# Patient Record
Sex: Male | Born: 2000 | Race: Black or African American | Hispanic: No | Marital: Single | Smoking: Never smoker
Health system: Southern US, Community
[De-identification: ages and names within clinical notes are randomized; demographics above are authoritative.]

## PROBLEM LIST (undated history)

## (undated) DIAGNOSIS — R011 Cardiac murmur, unspecified: Secondary | ICD-10-CM

## (undated) DIAGNOSIS — F952 Tourette's disorder: Secondary | ICD-10-CM

## (undated) DIAGNOSIS — F909 Attention-deficit hyperactivity disorder, unspecified type: Secondary | ICD-10-CM

---
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
## 2020-01-20 DIAGNOSIS — F913 Oppositional defiant disorder: Secondary | ICD-10-CM | POA: Diagnosis not present

## 2020-01-20 DIAGNOSIS — F3481 Disruptive mood dysregulation disorder: Secondary | ICD-10-CM | POA: Diagnosis not present

## 2020-01-20 DIAGNOSIS — F902 Attention-deficit hyperactivity disorder, combined type: Secondary | ICD-10-CM | POA: Diagnosis not present

## 2020-03-10 DIAGNOSIS — F411 Generalized anxiety disorder: Secondary | ICD-10-CM | POA: Diagnosis not present

## 2020-03-12 DIAGNOSIS — F411 Generalized anxiety disorder: Secondary | ICD-10-CM | POA: Diagnosis not present

## 2020-04-14 DIAGNOSIS — Z23 Encounter for immunization: Secondary | ICD-10-CM | POA: Diagnosis not present

## 2020-05-01 DIAGNOSIS — F411 Generalized anxiety disorder: Secondary | ICD-10-CM | POA: Diagnosis not present

## 2020-05-05 DIAGNOSIS — Z23 Encounter for immunization: Secondary | ICD-10-CM | POA: Diagnosis not present

## 2020-05-13 DIAGNOSIS — F902 Attention-deficit hyperactivity disorder, combined type: Secondary | ICD-10-CM | POA: Diagnosis not present

## 2020-05-13 DIAGNOSIS — F913 Oppositional defiant disorder: Secondary | ICD-10-CM | POA: Diagnosis not present

## 2020-05-13 DIAGNOSIS — F3481 Disruptive mood dysregulation disorder: Secondary | ICD-10-CM | POA: Diagnosis not present

## 2020-07-08 DIAGNOSIS — F3481 Disruptive mood dysregulation disorder: Secondary | ICD-10-CM | POA: Diagnosis not present

## 2020-07-08 DIAGNOSIS — F913 Oppositional defiant disorder: Secondary | ICD-10-CM | POA: Diagnosis not present

## 2020-07-08 DIAGNOSIS — F902 Attention-deficit hyperactivity disorder, combined type: Secondary | ICD-10-CM | POA: Diagnosis not present

## 2020-09-10 DIAGNOSIS — F902 Attention-deficit hyperactivity disorder, combined type: Secondary | ICD-10-CM | POA: Diagnosis not present

## 2020-09-10 DIAGNOSIS — F3481 Disruptive mood dysregulation disorder: Secondary | ICD-10-CM | POA: Diagnosis not present

## 2020-09-10 DIAGNOSIS — F913 Oppositional defiant disorder: Secondary | ICD-10-CM | POA: Diagnosis not present

## 2020-12-07 DIAGNOSIS — F902 Attention-deficit hyperactivity disorder, combined type: Secondary | ICD-10-CM | POA: Diagnosis not present

## 2020-12-07 DIAGNOSIS — F3481 Disruptive mood dysregulation disorder: Secondary | ICD-10-CM | POA: Diagnosis not present

## 2020-12-07 DIAGNOSIS — F913 Oppositional defiant disorder: Secondary | ICD-10-CM | POA: Diagnosis not present

## 2021-01-28 ENCOUNTER — Ambulatory Visit (INDEPENDENT_AMBULATORY_CARE_PROVIDER_SITE_OTHER): Payer: Medicaid Other | Admitting: Family Medicine

## 2021-01-28 ENCOUNTER — Encounter: Payer: Self-pay | Admitting: Family Medicine

## 2021-01-28 ENCOUNTER — Other Ambulatory Visit: Payer: Self-pay

## 2021-01-28 DIAGNOSIS — Z0000 Encounter for general adult medical examination without abnormal findings: Secondary | ICD-10-CM

## 2021-01-28 DIAGNOSIS — E669 Obesity, unspecified: Secondary | ICD-10-CM

## 2021-01-28 NOTE — Assessment & Plan Note (Signed)
Patient is doing well at this time with no active concerns.  Reports his mood is well.  He would like to go to vet school.  We will follow-up in 1 year or sooner if needed.

## 2021-01-28 NOTE — Patient Instructions (Signed)
It was great seeing you today.  The only changes I think you need to make to your current health is dietary changes.  I know you like to eat a lot of junk foods but consider alternating junk food snacks with healthy options such as fruits or vegetables.  I also recommend decreasing her soda intake and switching out for water.  I am not going to collect any labs today.  If you have any issues, questions, concerns please feel free to call our clinic.  I hope you have a wonderful afternoon!

## 2021-01-28 NOTE — Assessment & Plan Note (Signed)
Discussed healthy eating habits today such as transitioning to healthy snacks such as carrots and celery rather than chips at least some of the time.  Also encouraged decreasing the amount of soda and increasing the amount of water.  We discussed exercise as well.  We will follow-up at next visit.  Patient had no other questions or concerns

## 2021-01-28 NOTE — Progress Notes (Signed)
    SUBJECTIVE:   CHIEF COMPLAINT / HPI:   Annual Physical  and discussed obesity Patient reports no current concerns or issues.  We discussed his dietary habits and he reports that he does eat a lot of junk food.  His predominant snack is chips and he drinks soda and some water.  He reports that he does not exercise.  We discussed healthy eating habits and exercise regimens.   OBJECTIVE:   BP 119/69   Pulse 82   Ht 5\' 2"  (1.575 m)   Wt 196 lb 6.4 oz (89.1 kg)   SpO2 99%   BMI 35.92 kg/m   General: Well-appearing 20 year old male in no acute distress Cardiac: Regular rate and rhythm, no murmurs appreciated Respiratory: Normal work of breathing, lungs clear to auscultation bilaterally Abdomen: Soft, nontender, positive bowel sounds Extremities: No edema or gross deformities noted  PHQ9 SCORE ONLY 01/28/2021 11/27/2017  PHQ-9 Total Score 0 7     ASSESSMENT/PLAN:   Obesity (BMI 35.0-39.9 without comorbidity) Discussed healthy eating habits today such as transitioning to healthy snacks such as carrots and celery rather than chips at least some of the time.  Also encouraged decreasing the amount of soda and increasing the amount of water.  We discussed exercise as well.  We will follow-up at next visit.  Patient had no other questions or concerns  Health maintenance examination Patient is doing well at this time with no active concerns.  Reports his mood is well.  He would like to go to vet school.  We will follow-up in 1 year or sooner if needed.    10-14-1990, MD [facility]

## 2021-03-08 DIAGNOSIS — F913 Oppositional defiant disorder: Secondary | ICD-10-CM | POA: Diagnosis not present

## 2021-03-08 DIAGNOSIS — F902 Attention-deficit hyperactivity disorder, combined type: Secondary | ICD-10-CM | POA: Diagnosis not present

## 2021-03-08 DIAGNOSIS — F3481 Disruptive mood dysregulation disorder: Secondary | ICD-10-CM | POA: Diagnosis not present

## 2021-03-22 DIAGNOSIS — Z20822 Contact with and (suspected) exposure to covid-19: Secondary | ICD-10-CM | POA: Diagnosis not present

## 2021-03-27 DIAGNOSIS — Z20822 Contact with and (suspected) exposure to covid-19: Secondary | ICD-10-CM | POA: Diagnosis not present

## 2021-04-03 DIAGNOSIS — Z20822 Contact with and (suspected) exposure to covid-19: Secondary | ICD-10-CM | POA: Diagnosis not present

## 2021-04-10 DIAGNOSIS — F411 Generalized anxiety disorder: Secondary | ICD-10-CM | POA: Diagnosis not present

## 2021-04-10 DIAGNOSIS — Z20822 Contact with and (suspected) exposure to covid-19: Secondary | ICD-10-CM | POA: Diagnosis not present

## 2021-04-17 DIAGNOSIS — Z20822 Contact with and (suspected) exposure to covid-19: Secondary | ICD-10-CM | POA: Diagnosis not present

## 2021-04-19 DIAGNOSIS — F411 Generalized anxiety disorder: Secondary | ICD-10-CM | POA: Diagnosis not present

## 2021-04-24 DIAGNOSIS — F411 Generalized anxiety disorder: Secondary | ICD-10-CM | POA: Diagnosis not present

## 2021-04-25 DIAGNOSIS — F411 Generalized anxiety disorder: Secondary | ICD-10-CM | POA: Diagnosis not present

## 2021-04-28 DIAGNOSIS — F411 Generalized anxiety disorder: Secondary | ICD-10-CM | POA: Diagnosis not present

## 2021-05-07 DIAGNOSIS — Z20822 Contact with and (suspected) exposure to covid-19: Secondary | ICD-10-CM | POA: Diagnosis not present

## 2021-05-08 DIAGNOSIS — F411 Generalized anxiety disorder: Secondary | ICD-10-CM | POA: Diagnosis not present

## 2021-05-16 DIAGNOSIS — Z20822 Contact with and (suspected) exposure to covid-19: Secondary | ICD-10-CM | POA: Diagnosis not present

## 2021-05-20 DIAGNOSIS — Z20822 Contact with and (suspected) exposure to covid-19: Secondary | ICD-10-CM | POA: Diagnosis not present

## 2021-05-23 DIAGNOSIS — F411 Generalized anxiety disorder: Secondary | ICD-10-CM | POA: Diagnosis not present

## 2021-05-25 DIAGNOSIS — F411 Generalized anxiety disorder: Secondary | ICD-10-CM | POA: Diagnosis not present

## 2021-05-30 DIAGNOSIS — F411 Generalized anxiety disorder: Secondary | ICD-10-CM | POA: Diagnosis not present

## 2021-05-30 DIAGNOSIS — Z20822 Contact with and (suspected) exposure to covid-19: Secondary | ICD-10-CM | POA: Diagnosis not present

## 2021-06-03 DIAGNOSIS — F913 Oppositional defiant disorder: Secondary | ICD-10-CM | POA: Diagnosis not present

## 2021-06-03 DIAGNOSIS — F3481 Disruptive mood dysregulation disorder: Secondary | ICD-10-CM | POA: Diagnosis not present

## 2021-06-03 DIAGNOSIS — F902 Attention-deficit hyperactivity disorder, combined type: Secondary | ICD-10-CM | POA: Diagnosis not present

## 2021-06-04 DIAGNOSIS — Z20822 Contact with and (suspected) exposure to covid-19: Secondary | ICD-10-CM | POA: Diagnosis not present

## 2021-06-09 NOTE — Progress Notes (Deleted)
    SUBJECTIVE:   CHIEF COMPLAINT / HPI:   YESTERDAY BIRTHDAY   Blown Eardrum  PERTINENT  PMH / PSH: Obesity, ADHD  OBJECTIVE:   There were no vitals taken for this visit.  ***  ASSESSMENT/PLAN:   No problem-specific Assessment & Plan notes found for this encounter.     Bess Kinds, MD [facility]

## 2021-06-10 ENCOUNTER — Ambulatory Visit: Payer: Medicaid Other | Admitting: Student

## 2021-06-14 DIAGNOSIS — Z20822 Contact with and (suspected) exposure to covid-19: Secondary | ICD-10-CM | POA: Diagnosis not present

## 2021-06-21 DIAGNOSIS — Z20822 Contact with and (suspected) exposure to covid-19: Secondary | ICD-10-CM | POA: Diagnosis not present

## 2021-08-17 DIAGNOSIS — F411 Generalized anxiety disorder: Secondary | ICD-10-CM | POA: Diagnosis not present

## 2021-08-31 DIAGNOSIS — F913 Oppositional defiant disorder: Secondary | ICD-10-CM | POA: Diagnosis not present

## 2021-08-31 DIAGNOSIS — F902 Attention-deficit hyperactivity disorder, combined type: Secondary | ICD-10-CM | POA: Diagnosis not present

## 2021-08-31 DIAGNOSIS — F3481 Disruptive mood dysregulation disorder: Secondary | ICD-10-CM | POA: Diagnosis not present

## 2021-10-30 DIAGNOSIS — F411 Generalized anxiety disorder: Secondary | ICD-10-CM | POA: Diagnosis not present

## 2021-11-02 DIAGNOSIS — F3481 Disruptive mood dysregulation disorder: Secondary | ICD-10-CM | POA: Diagnosis not present

## 2021-11-02 DIAGNOSIS — F913 Oppositional defiant disorder: Secondary | ICD-10-CM | POA: Diagnosis not present

## 2021-11-02 DIAGNOSIS — F902 Attention-deficit hyperactivity disorder, combined type: Secondary | ICD-10-CM | POA: Diagnosis not present

## 2021-11-30 DIAGNOSIS — F3481 Disruptive mood dysregulation disorder: Secondary | ICD-10-CM | POA: Diagnosis not present

## 2021-11-30 DIAGNOSIS — F913 Oppositional defiant disorder: Secondary | ICD-10-CM | POA: Diagnosis not present

## 2021-11-30 DIAGNOSIS — F902 Attention-deficit hyperactivity disorder, combined type: Secondary | ICD-10-CM | POA: Diagnosis not present

## 2021-12-13 DIAGNOSIS — F411 Generalized anxiety disorder: Secondary | ICD-10-CM | POA: Diagnosis not present

## 2022-01-26 DIAGNOSIS — F3481 Disruptive mood dysregulation disorder: Secondary | ICD-10-CM | POA: Diagnosis not present

## 2022-01-26 DIAGNOSIS — F902 Attention-deficit hyperactivity disorder, combined type: Secondary | ICD-10-CM | POA: Diagnosis not present

## 2022-01-26 DIAGNOSIS — F913 Oppositional defiant disorder: Secondary | ICD-10-CM | POA: Diagnosis not present

## 2022-03-28 DIAGNOSIS — F3481 Disruptive mood dysregulation disorder: Secondary | ICD-10-CM | POA: Diagnosis not present

## 2022-03-28 DIAGNOSIS — F902 Attention-deficit hyperactivity disorder, combined type: Secondary | ICD-10-CM | POA: Diagnosis not present

## 2022-03-28 DIAGNOSIS — F913 Oppositional defiant disorder: Secondary | ICD-10-CM | POA: Diagnosis not present

## 2022-05-27 DIAGNOSIS — F3481 Disruptive mood dysregulation disorder: Secondary | ICD-10-CM | POA: Diagnosis not present

## 2022-05-27 DIAGNOSIS — F913 Oppositional defiant disorder: Secondary | ICD-10-CM | POA: Diagnosis not present

## 2022-05-27 DIAGNOSIS — F902 Attention-deficit hyperactivity disorder, combined type: Secondary | ICD-10-CM | POA: Diagnosis not present

## 2022-08-23 DIAGNOSIS — F3481 Disruptive mood dysregulation disorder: Secondary | ICD-10-CM | POA: Diagnosis not present

## 2022-08-23 DIAGNOSIS — F902 Attention-deficit hyperactivity disorder, combined type: Secondary | ICD-10-CM | POA: Diagnosis not present

## 2022-08-23 DIAGNOSIS — F913 Oppositional defiant disorder: Secondary | ICD-10-CM | POA: Diagnosis not present

## 2022-11-16 DIAGNOSIS — F913 Oppositional defiant disorder: Secondary | ICD-10-CM | POA: Diagnosis not present

## 2022-11-16 DIAGNOSIS — F902 Attention-deficit hyperactivity disorder, combined type: Secondary | ICD-10-CM | POA: Diagnosis not present

## 2022-11-16 DIAGNOSIS — F3481 Disruptive mood dysregulation disorder: Secondary | ICD-10-CM | POA: Diagnosis not present

## 2023-02-14 DIAGNOSIS — F3481 Disruptive mood dysregulation disorder: Secondary | ICD-10-CM | POA: Diagnosis not present

## 2023-02-14 DIAGNOSIS — F902 Attention-deficit hyperactivity disorder, combined type: Secondary | ICD-10-CM | POA: Diagnosis not present

## 2023-02-14 DIAGNOSIS — F913 Oppositional defiant disorder: Secondary | ICD-10-CM | POA: Diagnosis not present

## 2023-03-17 ENCOUNTER — Telehealth: Payer: Self-pay

## 2023-03-17 NOTE — Telephone Encounter (Signed)
LVM for patient to call back. AS, CMA 

## 2023-05-23 DIAGNOSIS — F3481 Disruptive mood dysregulation disorder: Secondary | ICD-10-CM | POA: Diagnosis not present

## 2023-05-23 DIAGNOSIS — F913 Oppositional defiant disorder: Secondary | ICD-10-CM | POA: Diagnosis not present

## 2023-05-23 DIAGNOSIS — F902 Attention-deficit hyperactivity disorder, combined type: Secondary | ICD-10-CM | POA: Diagnosis not present

## 2023-08-24 DIAGNOSIS — F902 Attention-deficit hyperactivity disorder, combined type: Secondary | ICD-10-CM | POA: Diagnosis not present

## 2023-08-24 DIAGNOSIS — F913 Oppositional defiant disorder: Secondary | ICD-10-CM | POA: Diagnosis not present

## 2023-08-24 DIAGNOSIS — F3481 Disruptive mood dysregulation disorder: Secondary | ICD-10-CM | POA: Diagnosis not present

## 2023-11-13 DIAGNOSIS — F913 Oppositional defiant disorder: Secondary | ICD-10-CM | POA: Diagnosis not present

## 2023-11-13 DIAGNOSIS — F902 Attention-deficit hyperactivity disorder, combined type: Secondary | ICD-10-CM | POA: Diagnosis not present

## 2023-11-13 DIAGNOSIS — F3481 Disruptive mood dysregulation disorder: Secondary | ICD-10-CM | POA: Diagnosis not present
